# Patient Record
Sex: Female | Born: 1965 | Race: Black or African American | Hispanic: No | Marital: Single | Smoking: Never smoker
Health system: Southern US, Community
[De-identification: ages and names within clinical notes are randomized; demographics above are authoritative.]

## PROBLEM LIST (undated history)

## (undated) DIAGNOSIS — I10 Essential (primary) hypertension: Secondary | ICD-10-CM

## (undated) DIAGNOSIS — R7303 Prediabetes: Secondary | ICD-10-CM

## (undated) HISTORY — DX: Prediabetes: R73.03

## (undated) HISTORY — PX: ABDOMINAL HYSTERECTOMY: SHX81

## (undated) HISTORY — PX: HERNIA REPAIR: SHX51

## (undated) HISTORY — DX: Essential (primary) hypertension: I10

---

## 2006-12-04 ENCOUNTER — Emergency Department (HOSPITAL_COMMUNITY): Admission: EM | Admit: 2006-12-04 | Discharge: 2006-12-04 | Payer: Self-pay | Admitting: Emergency Medicine

## 2006-12-05 ENCOUNTER — Emergency Department (HOSPITAL_COMMUNITY): Admission: EM | Admit: 2006-12-05 | Discharge: 2006-12-05 | Payer: Self-pay | Admitting: Emergency Medicine

## 2006-12-16 ENCOUNTER — Emergency Department (HOSPITAL_COMMUNITY): Admission: EM | Admit: 2006-12-16 | Discharge: 2006-12-16 | Payer: Self-pay | Admitting: Emergency Medicine

## 2010-10-19 ENCOUNTER — Emergency Department (HOSPITAL_COMMUNITY)
Admission: EM | Admit: 2010-10-19 | Discharge: 2010-10-19 | Payer: Self-pay | Source: Home / Self Care | Admitting: Emergency Medicine

## 2021-09-14 NOTE — Progress Notes (Signed)
Subjective:    Tonya Patel - 55 y.o. female MRN [id number]  Date of birth: 1966/08/13  HPI  Tonya Patel is to establish care and annual physical exam.  Current issues and/or concerns:  HYPERTENSION: Currently taking: see medication list Med Adherence: No, reports has not taken any blood pressure medications for at least 8 months due to unable to get refills from previous primary provider Adherence with salt restriction (low-salt diet): _0  Yes, also she is vegan Exercise: Yes _1  No _2  Home Monitoring?: _3  Yes    _4  No Smoking _5  Yes _6  No SOB? _7  Yes    _8  No Chest Pain?: _9  Yes    _10  No Comments: Reports history of being followed by Cardiology 11 years ago after her mother passed away. Was placed on Metoprolol at that time.  2. DIABETES TYPE 2: Reports in the past A1c was 6.5% and with diet improvement was able lower A1c without medication. Reports eats a lot of potatoes when she is on the go. She is vegan.  3. RIGHT FOOT CONCERN: Right foot with scaling skin. Wears steel toe boots at her job. Using over-the-counter creams without relief.  4. PAP SMEAR: Reports history of ovary surgery and fallopian tubes removed in 2003 related to tubal pregnancy. History of complete hysterectomy. Plans to get medical records released to our office for review.    ROS per HPI    Health Maintenance:  Health Maintenance Due  Topic Date Due   COVID-19 Vaccine (1) Never done   Pneumococcal Vaccine 53-7 Years old (1 - PCV) Never done   FOOT EXAM  Never done   OPHTHALMOLOGY EXAM  Never done   PAP SMEAR-Modifier  Never done   COLONOSCOPY (Pts 45-77yr Insurance coverage will need to be confirmed)  Never done   MAMMOGRAM  Never done    Past Medical History: Patient Active Problem List   Diagnosis Date Noted   Hyperlipidemia 09/20/2021   Essential hypertension 09/19/2021   Diabetes mellitus, type 2 ([facility] 09/19/2021    Social History   reports that she has never smoked.  She has never used smokeless tobacco. She reports that she does not currently use alcohol. She reports that she does not use drugs.   Family History  family history is not on file.   Medications: reviewed and updated   Objective:   Physical Exam BP (!) 177/96 (BP Location: Left Arm, Patient Position: Sitting, Cuff Size: Large)   Pulse (!) 124   Temp 98.4 F (36.9 C)   Resp 18   Ht 5' 6.61" (1.692 m)   Wt 188 lb 12.8 oz (85.6 kg)   SpO2 97%   BMI 29.91 kg/m   Physical Exam HENT:     Head: Normocephalic and atraumatic.     Right Ear: Tympanic membrane, ear canal and external ear normal.     Left Ear: Tympanic membrane, ear canal and external ear normal.  Eyes:     Extraocular Movements: Extraocular movements intact.     Conjunctiva/sclera: Conjunctivae normal.     Pupils: Pupils are equal, round, and reactive to light.  Cardiovascular:     Rate and Rhythm: Normal rate and regular rhythm.     Pulses: Normal pulses.     Heart sounds: Normal heart sounds.  Pulmonary:     Effort: Pulmonary effort is normal.     Breath sounds: Normal breath sounds.  Chest:     Comments: Patient declined exam. Abdominal:     General: Bowel  sounds are normal.     Palpations: Abdomen is soft.  Genitourinary:    Comments: Patient declined exam. Musculoskeletal:        General: Normal range of motion.     Cervical back: Normal range of motion and neck supple.  Skin:    General: Skin is warm and dry.     Capillary Refill: Capillary refill takes less than 2 seconds.     Comments: Mild athlete's foot located at right foot only 4th and 5th toes.  Neurological:     General: No focal deficit present.     Mental Status: She is alert and oriented to person, place, and time.  Psychiatric:        Mood and Affect: Mood normal.        Behavior: Behavior normal.    Assessment & Plan:  1. Encounter to establish care: 2. Annual physical exam: - Counseled on 150 minutes of exercise per week as  tolerated, healthy eating (including decreased daily intake of saturated fats, cholesterol, added sugars, sodium), STI prevention, and routine healthcare maintenance.  3. Screening for metabolic disorder: - ONG29+BMWU to check kidney function, liver function, and electrolyte balance.  - CMP14+EGFR  4. Screening for deficiency anemia: - CBC to screen for anemia. - CBC  5. Screening cholesterol level: - Lipid panel to screen for high cholesterol.  - Lipid panel  6. Thyroid disorder screen: - TSH to check thyroid function.  - TSH  7. Need for hepatitis C screening test: - Hepatitis C antibody to screen for hepatitis C.  - Hepatitis C Antibody  8. Encounter for screening mammogram for malignant neoplasm of breast: - Referral for breast cancer screening by mammogram.  - MM Digital Screening; Future  9. Pap smear for cervical cancer screening: 10. Routine screening for STI (sexually transmitted infection): - Patient declined. Patient intends to have gynecological records released to our office for review.  11. Encounter for screening for HIV: - HIV antibody to screen for human immunodeficiency virus.  - HIV antibody (with reflex)  12. Colon cancer screening: - Referral to Gastroenterology for colon cancer screening by colonoscopy. - Ambulatory referral to Gastroenterology  13. Essential hypertension: - Blood pressure not at goal during today's visit. Patient asymptomatic without chest pressure, chest pain, palpitations, shortness of breath, worst headache of life, and any additional red flag symptoms. - Patient without blood pressure medications for at least 8 months due to being unable to receive refills from previous primary provider.  - Resume Hydrochlorothiazide, Losartan, and Metoprolol Succinate as prescribed.  - Counseled on blood pressure goal of less than 130/80, low-sodium, DASH diet, medication compliance, 150 minutes of moderate intensity exercise per week as  tolerated. Discussed medication compliance, adverse effects. - Follow-up with primary provider in 2 weeks or sooner if needed.  - hydrochlorothiazide (HYDRODIURIL) 25 MG tablet; Take 1 tablet (25 mg total) by mouth daily.  Dispense: 90 tablet; Refill: 0 - losartan (COZAAR) 100 MG tablet; Take 1 tablet (100 mg total) by mouth daily.  Dispense: 90 tablet; Refill: 0 - metoprolol succinate (TOPROL-XL) 50 MG 24 hr tablet; Take 1 tablet (50 mg total) by mouth daily. Take with or immediately following a meal.  Dispense: 90 tablet; Refill: 0  14. Type 2 diabetes mellitus without complication, without long-term current use of insulin ([place]): - Hemoglobin A1c today at goal at 6.9%, goal < 7%. - Counseled on recommendation of diabetic medication, patient declined.  - Discussed the importance of healthy eating habits, low-carbohydrate diet,  low-sugar diet, regular aerobic exercise (at least 150 minutes a week as tolerated) and medication compliance to achieve or maintain control of diabetes. - Follow-up with primary provider in 3 months or sooner if needed.  - POCT glycosylated hemoglobin (Hb A1C)  15. Tinea pedis of both feet: - Terbinafine cream as prescribed.  - Follow-up with primary provider as scheduled.  - terbinafine (LAMISIL) 1 % cream; Apply 1 application topically 2 (two) times daily for 14 days.  Dispense: 28 g; Refill: 0     Patient was given clear instructions to go to Emergency Department or return to medical center if symptoms don't improve, worsen, or new problems develop.The patient verbalized understanding.  I discussed the assessment and treatment plan with the patient. The patient was provided an opportunity to ask questions and all were answered. The patient agreed with the plan and demonstrated an understanding of the instructions.   The patient was advised to call back or seek an in-person evaluation if the symptoms worsen or if the condition fails to improve as  anticipated.    Durene Fruits, NP 09/21/2021, 7:57 AM Primary Care at [facility]

## 2021-09-19 ENCOUNTER — Encounter (INDEPENDENT_AMBULATORY_CARE_PROVIDER_SITE_OTHER): Payer: Self-pay

## 2021-09-19 ENCOUNTER — Encounter: Payer: Self-pay | Admitting: Family

## 2021-09-19 ENCOUNTER — Other Ambulatory Visit: Payer: Self-pay

## 2021-09-19 ENCOUNTER — Ambulatory Visit (INDEPENDENT_AMBULATORY_CARE_PROVIDER_SITE_OTHER): Payer: 59 | Admitting: Family

## 2021-09-19 VITALS — BP 177/96 | HR 124 | Temp 98.4°F | Resp 18 | Ht 66.61 in | Wt 188.8 lb

## 2021-09-19 DIAGNOSIS — Z13220 Encounter for screening for lipoid disorders: Secondary | ICD-10-CM

## 2021-09-19 DIAGNOSIS — E119 Type 2 diabetes mellitus without complications: Secondary | ICD-10-CM | POA: Insufficient documentation

## 2021-09-19 DIAGNOSIS — Z0001 Encounter for general adult medical examination with abnormal findings: Secondary | ICD-10-CM

## 2021-09-19 DIAGNOSIS — B353 Tinea pedis: Secondary | ICD-10-CM | POA: Diagnosis not present

## 2021-09-19 DIAGNOSIS — Z13228 Encounter for screening for other metabolic disorders: Secondary | ICD-10-CM

## 2021-09-19 DIAGNOSIS — Z114 Encounter for screening for human immunodeficiency virus [HIV]: Secondary | ICD-10-CM

## 2021-09-19 DIAGNOSIS — Z113 Encounter for screening for infections with a predominantly sexual mode of transmission: Secondary | ICD-10-CM

## 2021-09-19 DIAGNOSIS — I10 Essential (primary) hypertension: Secondary | ICD-10-CM

## 2021-09-19 DIAGNOSIS — Z0000 Encounter for general adult medical examination without abnormal findings: Secondary | ICD-10-CM

## 2021-09-19 DIAGNOSIS — Z7689 Persons encountering health services in other specified circumstances: Secondary | ICD-10-CM | POA: Diagnosis not present

## 2021-09-19 DIAGNOSIS — Z124 Encounter for screening for malignant neoplasm of cervix: Secondary | ICD-10-CM

## 2021-09-19 DIAGNOSIS — Z1159 Encounter for screening for other viral diseases: Secondary | ICD-10-CM

## 2021-09-19 DIAGNOSIS — Z130 Encounter for screening for diseases of the blood and blood-forming organs and certain disorders involving the immune mechanism: Secondary | ICD-10-CM

## 2021-09-19 DIAGNOSIS — Z1211 Encounter for screening for malignant neoplasm of colon: Secondary | ICD-10-CM

## 2021-09-19 DIAGNOSIS — Z1231 Encounter for screening mammogram for malignant neoplasm of breast: Secondary | ICD-10-CM

## 2021-09-19 DIAGNOSIS — Z1329 Encounter for screening for other suspected endocrine disorder: Secondary | ICD-10-CM

## 2021-09-19 DIAGNOSIS — Z131 Encounter for screening for diabetes mellitus: Secondary | ICD-10-CM

## 2021-09-19 MED ORDER — HYDROCHLOROTHIAZIDE 25 MG PO TABS: 25.0000 mg | ORAL_TABLET | Freq: Every day | ORAL | 0 refills | Status: DC

## 2021-09-19 MED ORDER — METOPROLOL SUCCINATE ER 50 MG PO TB24: 50.0000 mg | ORAL_TABLET | Freq: Every day | ORAL | 0 refills | Status: DC

## 2021-09-19 MED ORDER — TERBINAFINE HCL 1 % EX CREA: 1.0000 "application " | TOPICAL_CREAM | Freq: Two times a day (BID) | CUTANEOUS | 0 refills | Status: AC

## 2021-09-19 MED ORDER — LOSARTAN POTASSIUM 100 MG PO TABS: 100.0000 mg | ORAL_TABLET | Freq: Every day | ORAL | 0 refills | Status: DC

## 2021-09-19 NOTE — Progress Notes (Signed)
Pt presents to establish care and annual physical w/pap, pt concerned about A1c due to her diet

## 2021-09-19 NOTE — Patient Instructions (Signed)
Thank you for choosing Primary Care at [facility] for your medical home!    Tonya Patel was seen by Camillia Herter, NP today.   Tonya Patel's primary care provider is Durene Fruits, NP.   For the best care possible,  you should try to see Durene Fruits, NP whenever you come to clinic.   We look forward to seeing you again soon!  If you have any questions about your visit today,  please call us at [phone]  Or feel free to reach your provider via [place].    Preventive Care 57-36 Years Old, Female Preventive care refers to lifestyle choices and visits with your health care provider that can promote health and wellness. Preventive care visits are also called wellness exams. What can I expect for my preventive care visit? Counseling Your health care provider may ask you questions about your: Medical history, including: Past medical problems. Family medical history. Pregnancy history. Current health, including: Menstrual cycle. Method of birth control. Emotional well-being. Home life and relationship well-being. Sexual activity and sexual health. Lifestyle, including: Alcohol, nicotine or tobacco, and drug use. Access to firearms. Diet, exercise, and sleep habits. Work and work Statistician. Sunscreen use. Safety issues such as seatbelt and bike helmet use. Physical exam Your health care provider will check your: Height and weight. These may be used to calculate your BMI (body mass index). BMI is a measurement that tells if you are at a healthy weight. Waist circumference. This measures the distance around your waistline. This measurement also tells if you are at a healthy weight and may help predict your risk of certain diseases, such as type 2 diabetes and high blood pressure. Heart rate and blood pressure. Body temperature. Skin for abnormal spots. What immunizations do I need? Vaccines are usually given at various ages, according to a schedule. Your health  care provider will recommend vaccines for you based on your age, medical history, and lifestyle or other factors, such as travel or where you work. What tests do I need? Screening Your health care provider may recommend screening tests for certain conditions. This may include: Lipid and cholesterol levels. Diabetes screening. This is done by checking your blood sugar (glucose) after you have not eaten for a while (fasting). Pelvic exam and Pap test. Hepatitis B test. Hepatitis C test. HIV (human immunodeficiency virus) test. STI (sexually transmitted infection) testing, if you are at risk. Lung cancer screening. Colorectal cancer screening. Mammogram. Talk with your health care provider about when you should start having regular mammograms. This may depend on whether you have a family history of breast cancer. BRCA-related cancer screening. This may be done if you have a family history of breast, ovarian, tubal, or peritoneal cancers. Bone density scan. This is done to screen for osteoporosis. Talk with your health care provider about your test results, treatment options, and if necessary, the need for more tests. Follow these instructions at home: Eating and drinking  Eat a diet that includes fresh fruits and vegetables, whole grains, lean protein, and low-fat dairy products. Take vitamin and mineral supplements as recommended by your health care provider. Do not drink alcohol if: Your health care provider tells you not to drink. You are pregnant, may be pregnant, or are planning to become pregnant. If you drink alcohol: Limit how much you have to 0-1 drink a day. Know how much alcohol is in your drink. In the [place] one drink equals one 12 oz bottle of beer (355 mL), one  5 oz glass of wine (148 mL), or one 1 oz glass of hard liquor (44 mL). Lifestyle Brush your teeth every morning and night with fluoride toothpaste. Floss one time each day. Exercise for at least 30 minutes 5 or more  days each week. Do not use any products that contain nicotine or tobacco. These products include cigarettes, chewing tobacco, and vaping devices, such as e-cigarettes. If you need help quitting, ask your health care provider. Do not use drugs. If you are sexually active, practice safe sex. Use a condom or other form of protection to prevent STIs. If you do not wish to become pregnant, use a form of birth control. If you plan to become pregnant, see your health care provider for a prepregnancy visit. Take aspirin only as told by your health care provider. Make sure that you understand how much to take and what form to take. Work with your health care provider to find out whether it is safe and beneficial for you to take aspirin daily. Find healthy ways to manage stress, such as: Meditation, yoga, or listening to music. Journaling. Talking to a trusted person. Spending time with friends and family. Minimize exposure to UV radiation to reduce your risk of skin cancer. Safety Always wear your seat belt while driving or riding in a vehicle. Do not drive: If you have been drinking alcohol. Do not ride with someone who has been drinking. When you are tired or distracted. While texting. If you have been using any mind-altering substances or drugs. Wear a helmet and other protective equipment during sports activities. If you have firearms in your house, make sure you follow all gun safety procedures. Seek help if you have been physically or sexually abused. What's next? Visit your health care provider once a year for an annual wellness visit. Ask your health care provider how often you should have your eyes and teeth checked. Stay up to date on all vaccines. This information is not intended to replace advice given to you by your health care provider. Make sure you discuss any questions you have with your health care provider. Document Revised: 04/04/2021 Document Reviewed: 04/04/2021 Elsevier  Patient Education  [place].

## 2021-09-20 ENCOUNTER — Encounter: Payer: Self-pay | Admitting: Family

## 2021-09-20 ENCOUNTER — Other Ambulatory Visit: Payer: Self-pay | Admitting: Family

## 2021-09-20 DIAGNOSIS — E785 Hyperlipidemia, unspecified: Secondary | ICD-10-CM | POA: Insufficient documentation

## 2021-09-20 LAB — CMP14+EGFR
ALT: 24 IU/L (ref 0–32)
AST: 19 IU/L (ref 0–40)
Albumin/Globulin Ratio: 1.4 (ref 1.2–2.2)
Albumin: 4.7 g/dL (ref 3.8–4.9)
Alkaline Phosphatase: 111 IU/L (ref 44–121)
BUN/Creatinine Ratio: 13 (ref 9–23)
BUN: 11 mg/dL (ref 6–24)
Bilirubin Total: 0.4 mg/dL (ref 0.0–1.2)
CO2: 27 mmol/L (ref 20–29)
Calcium: 10.3 mg/dL — ABNORMAL HIGH (ref 8.7–10.2)
Chloride: 98 mmol/L (ref 96–106)
Creatinine, Ser: 0.87 mg/dL (ref 0.57–1.00)
Globulin, Total: 3.4 g/dL (ref 1.5–4.5)
Glucose: 110 mg/dL — ABNORMAL HIGH (ref 70–99)
Potassium: 3.9 mmol/L (ref 3.5–5.2)
Sodium: 138 mmol/L (ref 134–144)
Total Protein: 8.1 g/dL (ref 6.0–8.5)
eGFR: 78 mL/min/{1.73_m2} (ref >59)

## 2021-09-20 LAB — CBC
Hematocrit: 39.6 % (ref 34.0–46.6)
Hemoglobin: 13.5 g/dL (ref 11.1–15.9)
MCH: 27.1 pg (ref 26.6–33.0)
MCHC: 34.1 g/dL (ref 31.5–35.7)
MCV: 80 fL (ref 79–97)
Platelets: 290 10*3/uL (ref 150–450)
RBC: 4.98 x10E6/uL (ref 3.77–5.28)
RDW: 14 % (ref 11.7–15.4)
WBC: 6.6 10*3/uL (ref 3.4–10.8)

## 2021-09-20 LAB — LIPID PANEL
Chol/HDL Ratio: 3.2 ratio (ref 0.0–4.4)
Cholesterol, Total: 219 mg/dL — ABNORMAL HIGH (ref 100–199)
HDL: 68 mg/dL (ref >39)
LDL Chol Calc (NIH): 137 mg/dL — ABNORMAL HIGH (ref 0–99)
Triglycerides: 77 mg/dL (ref 0–149)
VLDL Cholesterol Cal: 14 mg/dL (ref 5–40)

## 2021-09-20 LAB — HIV ANTIBODY (ROUTINE TESTING W REFLEX): HIV Screen 4th Generation wRfx: NONREACTIVE

## 2021-09-20 LAB — HEPATITIS C ANTIBODY: Hep C Virus Ab: <0.1 s/co ratio (ref 0.0–0.9)

## 2021-09-20 LAB — POCT GLYCOSYLATED HEMOGLOBIN (HGB A1C): Hemoglobin A1C: 6.9 % — AB (ref 4.0–5.6)

## 2021-09-20 LAB — TSH: TSH: 1.41 u[IU]/mL (ref 0.450–4.500)

## 2021-09-20 MED ORDER — ATORVASTATIN CALCIUM 40 MG PO TABS: 40.0000 mg | ORAL_TABLET | Freq: Every day | ORAL | 0 refills | Status: DC

## 2021-09-20 NOTE — Progress Notes (Signed)
Diabetes discussed in office.

## 2021-09-20 NOTE — Progress Notes (Signed)
Please call patient with update.   Kidney function normal.  Liver function normal.  Thyroid function normal.   No anemia.   Hepatitis C negative.   HIV negative.   Cholesterol higher than expected. High cholesterol may increase risk of heart attack and/or stroke. Consider eating more fruits, vegetables, and lean baked meats such as chicken or fish. Moderate intensity exercise at least 150 minutes as tolerated per week may help as well.   Begin Atorvastatin (Lipitor) for high cholesterol. Patient encouraged to recheck cholesterol in 6 to 8 weeks at office visit.  The following is for provider reference only: The 10-year ASCVD risk score (Arnett DK, et al., 2019) is: 30.8%   Values used to calculate the score:     Age: 55 years     Sex: Female     Is Non-Hispanic African American: Yes     Diabetic: Yes     Tobacco smoker: No     Systolic Blood Pressure: 177 mmHg     Is BP treated: Yes     HDL Cholesterol: 68 mg/dL     Total Cholesterol: 219 mg/dL

## 2021-09-21 NOTE — Telephone Encounter (Signed)
Spoke to pt in regards to medication. Pt informed about Lamisil can be purchased OTC and is the same medication provider prescribed.

## 2021-09-30 NOTE — Progress Notes (Deleted)
Patient ID: Tonya Patel, female    DOB: March 30, 1966  MRN: [id number]  CC: Hypertension Follow-Up   Subjective: Tonya Patel is a 55 y.o. female who presents for hypertension follow-up.   Her concerns today include:   HYPERTENSION FOLLOW-UP: 09/19/2021: - Patient without blood pressure medications for at least 8 months due to being unable to receive refills from previous primary provider.  - Resume Hydrochlorothiazide, Losartan, and Metoprolol Succinate as prescribed.   10/03/2021:  Patient Active Problem List   Diagnosis Date Noted   Hyperlipidemia 09/20/2021   Essential hypertension 09/19/2021   Diabetes mellitus, type 2 (HCC) 09/19/2021     Current Outpatient Medications on File Prior to Visit  Medication Sig Dispense Refill   atorvastatin (LIPITOR) 40 MG tablet Take 1 tablet (40 mg total) by mouth daily. 120 tablet 0   hydrochlorothiazide (HYDRODIURIL) 25 MG tablet Take 1 tablet (25 mg total) by mouth daily. 90 tablet 0   losartan (COZAAR) 100 MG tablet Take 1 tablet (100 mg total) by mouth daily. 90 tablet 0   metoprolol succinate (TOPROL-XL) 50 MG 24 hr tablet Take 1 tablet (50 mg total) by mouth daily. Take with or immediately following a meal. 90 tablet 0   terbinafine (LAMISIL) 1 % cream Apply 1 application topically 2 (two) times daily for 14 days. 28 g 0   No current facility-administered medications on file prior to visit.    No Known Allergies  Social History   Socioeconomic History   Marital status: Single    Spouse name: Not on file   Number of children: Not on file   Years of education: Not on file   Highest education level: Not on file  Occupational History   Not on file  Tobacco Use   Smoking status: Never   Smokeless tobacco: Never  Vaping Use   Vaping Use: Never used  Substance and Sexual Activity   Alcohol use: Not Currently   Drug use: Never   Sexual activity: Never    Birth control/protection: Surgical  Other Topics Concern    Not on file  Social History Narrative   Not on file   Social Determinants of Health   Financial Resource Strain: Not on file  Food Insecurity: Not on file  Transportation Needs: Not on file  Physical Activity: Not on file  Stress: Not on file  Social Connections: Not on file  Intimate Partner Violence: Not on file    No family history on file.  Past Surgical History:  Procedure Laterality Date   ABDOMINAL HYSTERECTOMY     HERNIA REPAIR      ROS: Review of Systems Negative except as stated above  PHYSICAL EXAM: There were no vitals taken for this visit.  Physical Exam  {female adult master:310786} {female adult master:310785}  CMP Latest Ref Rng & Units 09/19/2021  Glucose 70 - 99 mg/dL 151(V)  BUN 6 - 24 mg/dL 11  Creatinine 6.16 - 0.73 mg/dL 7.10  Sodium 626 - 948 mmol/L 138  Potassium 3.5 - 5.2 mmol/L 3.9  Chloride 96 - 106 mmol/L 98  CO2 20 - 29 mmol/L 27  Calcium 8.7 - 10.2 mg/dL 10.3(H)  Total Protein 6.0 - 8.5 g/dL 8.1  Total Bilirubin 0.0 - 1.2 mg/dL 0.4  Alkaline Phos 44 - 121 IU/L 111  AST 0 - 40 IU/L 19  ALT 0 - 32 IU/L 24   Lipid Panel     Component Value Date/Time   CHOL 219 (H) 09/19/2021 1707  TRIG 77 09/19/2021 1707   HDL 68 09/19/2021 1707   CHOLHDL 3.2 09/19/2021 1707   LDLCALC 137 (H) 09/19/2021 1707    CBC    Component Value Date/Time   WBC 6.6 09/19/2021 1707   RBC 4.98 09/19/2021 1707   HGB 13.5 09/19/2021 1707   HCT 39.6 09/19/2021 1707   PLT 290 09/19/2021 1707   MCV 80 09/19/2021 1707   MCH 27.1 09/19/2021 1707   MCHC 34.1 09/19/2021 1707   RDW 14.0 09/19/2021 1707    ASSESSMENT AND PLAN:  There are no diagnoses linked to this encounter.   Patient was given the opportunity to ask questions.  Patient verbalized understanding of the plan and was able to repeat key elements of the plan. Patient was given clear instructions to go to Emergency Department or return to medical center if symptoms don't improve, worsen, or  new problems develop.The patient verbalized understanding.   No orders of the defined types were placed in this encounter.    Requested Prescriptions    No prescriptions requested or ordered in this encounter    No follow-ups on file.  Rema Fendt, NP

## 2021-10-01 ENCOUNTER — Telehealth: Payer: Self-pay | Admitting: Family

## 2021-10-01 NOTE — Telephone Encounter (Signed)
Pt states she can't make it to her appt this week because she's got a schedule FROM 5 AM -5PM. She could send her BP readings and could come in another time (nurse visit?) if PCP  deems necessary or come in w/ PCP but pt not sure what to do w/ schedule. Pt asking for a call from nurse to know how best to sched.

## 2021-10-02 NOTE — Telephone Encounter (Signed)
Pt can follow-up with provider at her preference and convenience.

## 2021-10-03 ENCOUNTER — Ambulatory Visit: Payer: 59 | Admitting: Family

## 2021-10-03 DIAGNOSIS — I10 Essential (primary) hypertension: Secondary | ICD-10-CM

## 2021-10-29 ENCOUNTER — Other Ambulatory Visit: Payer: Self-pay

## 2021-10-29 ENCOUNTER — Ambulatory Visit
Admission: RE | Admit: 2021-10-29 | Discharge: 2021-10-29 | Disposition: A | Discharge status: Home / Self Care | Payer: 59 | Source: Ambulatory Visit | Attending: Family | Admitting: Family

## 2021-10-29 DIAGNOSIS — Z1231 Encounter for screening mammogram for malignant neoplasm of breast: Secondary | ICD-10-CM

## 2021-10-31 ENCOUNTER — Encounter: Payer: Self-pay | Admitting: Family

## 2021-11-01 NOTE — Progress Notes (Signed)
Mammogram with no evidence of malignancy. Repeat in 12 months.

## 2021-12-28 ENCOUNTER — Other Ambulatory Visit: Payer: Self-pay | Admitting: Family

## 2021-12-28 DIAGNOSIS — I10 Essential (primary) hypertension: Secondary | ICD-10-CM

## 2022-01-01 NOTE — Progress Notes (Deleted)
? ? ?Patient ID: Tonya Patel, female    DOB: 23-Oct-1965  MRN: [id number] ? ?CC: Hypertension Follow-Up ? ?Subjective: ?Tonya Patel is a 56 y.o. female who presents for hypertension follow-up. ? ?Her concerns today include:  ?HYPERTENSION FOLLOW-UP: ?09/19/2021: ?- Resume Hydrochlorothiazide, Losartan, and Metoprolol Succinate as prescribed.  ? ?01/03/2022: ? ?2. DIABETES TYPE 2 FOLLOW-UP: ?09/19/2021: ?- Hemoglobin A1c today at goal at 6.9%, goal < 7%. ?- Counseled on recommendation of diabetic medication, patient declined.  ? ?01/03/2022: ? ?Patient Active Problem List  ? Diagnosis Date Noted  ? Hyperlipidemia 09/20/2021  ? Essential hypertension 09/19/2021  ? Diabetes mellitus, type 2 (HCC) 09/19/2021  ?  ? ?Current Outpatient Medications on File Prior to Visit  ?Medication Sig Dispense Refill  ? atorvastatin (LIPITOR) 40 MG tablet Take 1 tablet (40 mg total) by mouth daily. 120 tablet 0  ? hydrochlorothiazide (HYDRODIURIL) 25 MG tablet Take 1 tablet (25 mg total) by mouth daily. 90 tablet 0  ? losartan (COZAAR) 100 MG tablet Take 1 tablet (100 mg total) by mouth daily. 90 tablet 0  ? metoprolol succinate (TOPROL-XL) 50 MG 24 hr tablet Take 1 tablet (50 mg total) by mouth daily. Take with or immediately following a meal. 90 tablet 0  ? ?No current facility-administered medications on file prior to visit.  ? ? ?No Known Allergies ? ?Social History  ? ?Socioeconomic History  ? Marital status: Single  ?  Spouse name: Not on file  ? Number of children: Not on file  ? Years of education: Not on file  ? Highest education level: Not on file  ?Occupational History  ? Not on file  ?Tobacco Use  ? Smoking status: Never  ? Smokeless tobacco: Never  ?Vaping Use  ? Vaping Use: Never used  ?Substance and Sexual Activity  ? Alcohol use: Not Currently  ? Drug use: Never  ? Sexual activity: Never  ?  Birth control/protection: Surgical  ?Other Topics Concern  ? Not on file  ?Social History Narrative  ? Not on file  ? ?Social  Determinants of Health  ? ?Financial Resource Strain: Not on file  ?Food Insecurity: Not on file  ?Transportation Needs: Not on file  ?Physical Activity: Not on file  ?Stress: Not on file  ?Social Connections: Not on file  ?Intimate Partner Violence: Not on file  ? ? ?Family History  ?Problem Relation Age of Onset  ? Breast cancer Maternal Aunt   ? ? ?Past Surgical History:  ?Procedure Laterality Date  ? ABDOMINAL HYSTERECTOMY    ? HERNIA REPAIR    ? ? ?ROS: ?Review of Systems ?Negative except as stated above ? ?PHYSICAL EXAM: ?There were no vitals taken for this visit. ? ?Physical Exam ? ?{female adult master:310786} ?{female adult master:310785} ? ?CMP Latest Ref Rng & Units 09/19/2021  ?Glucose 70 - 99 mg/dL 062(B)  ?BUN 6 - 24 mg/dL 11  ?Creatinine 0.57 - 1.00 mg/dL 7.62  ?Sodium 134 - 144 mmol/L 138  ?Potassium 3.5 - 5.2 mmol/L 3.9  ?Chloride 96 - 106 mmol/L 98  ?CO2 20 - 29 mmol/L 27  ?Calcium 8.7 - 10.2 mg/dL 10.3(H)  ?Total Protein 6.0 - 8.5 g/dL 8.1  ?Total Bilirubin 0.0 - 1.2 mg/dL 0.4  ?Alkaline Phos 44 - 121 IU/L 111  ?AST 0 - 40 IU/L 19  ?ALT 0 - 32 IU/L 24  ? ?Lipid Panel  ?   ?Component Value Date/Time  ? CHOL 219 (H) 09/19/2021 1707  ? TRIG 77 09/19/2021  1707  ? HDL 68 09/19/2021 1707  ? CHOLHDL 3.2 09/19/2021 1707  ? LDLCALC 137 (H) 09/19/2021 1707  ? ? ?CBC ?   ?Component Value Date/Time  ? WBC 6.6 09/19/2021 1707  ? RBC 4.98 09/19/2021 1707  ? HGB 13.5 09/19/2021 1707  ? HCT 39.6 09/19/2021 1707  ? PLT 290 09/19/2021 1707  ? MCV 80 09/19/2021 1707  ? MCH 27.1 09/19/2021 1707  ? MCHC 34.1 09/19/2021 1707  ? RDW 14.0 09/19/2021 1707  ? ? ?ASSESSMENT AND PLAN: ? ?There are no diagnoses linked to this encounter. ? ? ?Patient was given the opportunity to ask questions.  Patient verbalized understanding of the plan and was able to repeat key elements of the plan. Patient was given clear instructions to go to Emergency Department or return to medical center if symptoms don't improve, worsen, or new  problems develop.The patient verbalized understanding. ? ? ?No orders of the defined types were placed in this encounter. ? ? ? ?Requested Prescriptions  ? ? No prescriptions requested or ordered in this encounter  ? ? ?No follow-ups on file. ? ?Rema Fendt, NP  ?

## 2022-01-03 ENCOUNTER — Ambulatory Visit: Payer: 59 | Admitting: Family

## 2022-01-03 DIAGNOSIS — I10 Essential (primary) hypertension: Secondary | ICD-10-CM

## 2022-01-03 DIAGNOSIS — E119 Type 2 diabetes mellitus without complications: Secondary | ICD-10-CM

## 2022-01-06 NOTE — Progress Notes (Signed)
? ? ?Patient ID: Tonya Patel, female    DOB: Jul 04, 1966  MRN: [id number] ? ?CC: Hypertension Follow-Up ? ?Subjective: ?Tonya Patel is a 56 y.o. female who presents for hypertension follow-up.  ? ?Her concerns today include: ?HYPERTENSION FOLLOW-UP: ?09/19/2021: ?- Patient without blood pressure medications for at least 8 months due to being unable to receive refills from previous primary provider.  ?- Resume Hydrochlorothiazide, Losartan, and Metoprolol Succinate as prescribed.  ? ?01/11/2022: ?No blood pressure medication for at least 1 week due to needing refills. Also, reports has new health insurance coverage as she was recently hired by her employer full-time. Reports prior to being without blood pressure medication her home numbers were normal. ? ? ?2. DIABETES TYPE 2 FOLLOW-UP: ?09/19/2021: ?- Counseled on recommendation of diabetic medication, patient declined.  ? ?01/11/2022: ?Concern about beginning diabetic medication. Reports she heard about the side effects of medications causing organ failure. Also, concerns about taking medications can cause more things to go as she states "wrong." Reports she does eat a lot of french fries to supplement as she is vegan. Reports she is aware that she should exercise and wants to start doing so soon. ? ? ?Patient Active Problem List  ? Diagnosis Date Noted  ? Hyperlipidemia 09/20/2021  ? Essential hypertension 09/19/2021  ? Diabetes mellitus, type 2 ([place]) 09/19/2021  ?  ? ?No current outpatient medications on file prior to visit.  ? ?No current facility-administered medications on file prior to visit.  ? ? ?No Known Allergies ? ?Social History  ? ?Socioeconomic History  ? Marital status: Single  ?  Spouse name: Not on file  ? Number of children: Not on file  ? Years of education: Not on file  ? Highest education level: Not on file  ?Occupational History  ? Not on file  ?Tobacco Use  ? Smoking status: Never  ?  Passive exposure: Never  ? Smokeless tobacco: Never   ?Vaping Use  ? Vaping Use: Never used  ?Substance and Sexual Activity  ? Alcohol use: Not Currently  ? Drug use: Never  ? Sexual activity: Never  ?  Birth control/protection: Surgical  ?Other Topics Concern  ? Not on file  ?Social History Narrative  ? Not on file  ? ?Social Determinants of Health  ? ?Financial Resource Strain: Not on file  ?Food Insecurity: Not on file  ?Transportation Needs: Not on file  ?Physical Activity: Not on file  ?Stress: Not on file  ?Social Connections: Not on file  ?Intimate Partner Violence: Not on file  ? ? ?Family History  ?Problem Relation Age of Onset  ? Breast cancer Maternal Aunt   ? ? ?Past Surgical History:  ?Procedure Laterality Date  ? ABDOMINAL HYSTERECTOMY    ? HERNIA REPAIR    ? ? ?ROS: ?Review of Systems ?Negative except as stated above ? ?PHYSICAL EXAM: ?BP (!) 180/80 (BP Location: Left Arm, Patient Position: Sitting, Cuff Size: Normal)   Pulse 78   Temp 98.3 ?F (36.8 ?C)   Resp 18   Ht 5' 6.61" (1.692 m)   Wt 195 lb (88.5 kg)   SpO2 98%   BMI 30.90 kg/m?  ? ?Physical Exam ?HENT:  ?   Head: Normocephalic and atraumatic.  ?Eyes:  ?   Extraocular Movements: Extraocular movements intact.  ?   Conjunctiva/sclera: Conjunctivae normal.  ?   Pupils: Pupils are equal, round, and reactive to light.  ?Cardiovascular:  ?   Rate and Rhythm: Normal rate and regular rhythm.  ?  Pulses: Normal pulses.  ?   Heart sounds: Normal heart sounds.  ?Pulmonary:  ?   Effort: Pulmonary effort is normal.  ?   Breath sounds: Normal breath sounds.  ?Musculoskeletal:  ?   Cervical back: Normal range of motion and neck supple.  ?Neurological:  ?   General: No focal deficit present.  ?   Mental Status: She is alert and oriented to person, place, and time.  ?Psychiatric:     ?   Mood and Affect: Mood normal.     ?   Behavior: Behavior normal.  ? ?Results for orders placed or performed in visit on 01/11/22  ?Lipid Panel  ?Result Value Ref Range  ? Cholesterol, Total 183 100 - 199 mg/dL  ?  Triglycerides 105 0 - 149 mg/dL  ? HDL 53 >39 mg/dL  ? VLDL Cholesterol Cal 19 5 - 40 mg/dL  ? LDL Chol Calc (NIH) 111 (H) 0 - 99 mg/dL  ? Chol/HDL Ratio 3.5 0.0 - 4.4 ratio  ?POCT glycosylated hemoglobin (Hb A1C)  ?Result Value Ref Range  ? Hemoglobin A1C 7.0 (A) 4.0 - 5.6 %  ? HbA1c POC (<> result, manual entry)    ? HbA1c, POC (prediabetic range)    ? HbA1c, POC (controlled diabetic range)    ? ? ?ASSESSMENT AND PLAN: ?1. Essential (primary) hypertension: ?- Blood pressure not at goal during today's visit. Patient asymptomatic without chest pressure, chest pain, palpitations, shortness of breath, worst headache of life, and any additional red flag symptoms. ?- Continue Hydrochlorothiazide, Losartan, and Metoprolol Succinate as prescribed.  ?- Counseled on blood pressure goal of less than 130/80, low-sodium, DASH diet, medication compliance, and 150 minutes of moderate intensity exercise per week as tolerated. Counseled on medication adherence and adverse effects. ?- Follow-up with primary provider in 1 week or sooner if needed for blood pressure check. ?- hydrochlorothiazide (HYDRODIURIL) 25 MG tablet; Take 1 tablet (25 mg total) by mouth daily.  Dispense: 90 tablet; Refill: 0 ?- losartan (COZAAR) 100 MG tablet; Take 1 tablet (100 mg total) by mouth daily.  Dispense: 90 tablet; Refill: 0 ?- metoprolol succinate (TOPROL-XL) 50 MG 24 hr tablet; Take 1 tablet (50 mg total) by mouth daily. Take with or immediately following a meal.  Dispense: 90 tablet; Refill: 0 ? ?2. Type 2 diabetes mellitus without complication, without long-term current use of insulin ([place]): ?- Hemoglobin A1c 7.0%. ?- Begin Metformin as prescribed. Counseled on medication adherence and adverse effects.  ?- Discussed the importance of healthy eating habits, low-carbohydrate diet, low-sugar diet, regular aerobic exercise (at least 150 minutes a week as tolerated) and medication compliance to achieve or maintain control of diabetes. ?- To achieve an  A1C goal of less than or equal to 7.0 percent, a fasting blood sugar of 80 to 130 mg/dL and a postprandial glucose (90 to 120 minutes after a meal) less than 180 mg/dL. In the event of sugars less than 60 mg/dl or greater than 400 mg/dl please notify the clinic ASAP. It is recommended that you undergo annual eye exams and annual foot exams. ?- Follow-up with primary provider in 4 weeks or sooner if needed. ?- POCT glycosylated hemoglobin (Hb A1C) ?- metFORMIN (GLUCOPHAGE) 500 MG tablet; Take 1 tablet (500 mg total) by mouth 2 (two) times daily with a meal.  Dispense: 180 tablet; Refill: 0 ?- glucose blood test strip; 1 each by Other route 4 (four) times daily -  before meals and at bedtime. Use as instructed  Dispense:  100 each; Refill: 12 ?- Blood Glucose Monitoring Suppl (ACCU-CHEK GUIDE ME) w/Device KIT; 1 each by Does not apply route 4 (four) times daily -  before meals and at bedtime.  Dispense: 1 kit; Refill: 0 ?- Accu-Chek FastClix Lancets MISC; 1 each by Does not apply route 4 (four) times daily -  before meals and at bedtime.  Dispense: 102 each; Refill: 2 ? ?3. Hyperlipidemia, unspecified hyperlipidemia type: ?- Continue Atorvastatin as prescribed.  ?- Update lipid panel.  ?- Follow-up with primary provider as scheduled.  ?- Lipid Panel ? ? ?Patient was given the opportunity to ask questions.  Patient verbalized understanding of the plan and was able to repeat key elements of the plan. Patient was given clear instructions to go to Emergency Department or return to medical center if symptoms don't improve, worsen, or new problems develop.The patient verbalized understanding. ? ? ?Orders Placed This Encounter  ?Procedures  ? Lipid Panel  ? POCT glycosylated hemoglobin (Hb A1C)  ? ? ? ?Requested Prescriptions  ? ?Signed Prescriptions Disp Refills  ? hydrochlorothiazide (HYDRODIURIL) 25 MG tablet 90 tablet 0  ?  Sig: Take 1 tablet (25 mg total) by mouth daily.  ? losartan (COZAAR) 100 MG tablet 90 tablet 0  ?   Sig: Take 1 tablet (100 mg total) by mouth daily.  ? metoprolol succinate (TOPROL-XL) 50 MG 24 hr tablet 90 tablet 0  ?  Sig: Take 1 tablet (50 mg total) by mouth daily. Take with or immediately following a meal.

## 2022-01-11 ENCOUNTER — Ambulatory Visit (INDEPENDENT_AMBULATORY_CARE_PROVIDER_SITE_OTHER): Payer: 59 | Admitting: Family

## 2022-01-11 ENCOUNTER — Encounter: Payer: Self-pay | Admitting: Family

## 2022-01-11 ENCOUNTER — Other Ambulatory Visit: Payer: Self-pay

## 2022-01-11 VITALS — BP 180/80 | HR 78 | Temp 98.3°F | Resp 18 | Ht 66.61 in | Wt 195.0 lb

## 2022-01-11 DIAGNOSIS — E785 Hyperlipidemia, unspecified: Secondary | ICD-10-CM | POA: Diagnosis not present

## 2022-01-11 DIAGNOSIS — I10 Essential (primary) hypertension: Secondary | ICD-10-CM

## 2022-01-11 DIAGNOSIS — E119 Type 2 diabetes mellitus without complications: Secondary | ICD-10-CM

## 2022-01-11 LAB — POCT GLYCOSYLATED HEMOGLOBIN (HGB A1C): Hemoglobin A1C: 7 % — AB (ref 4.0–5.6)

## 2022-01-11 MED ORDER — GLUCOSE BLOOD VI STRP: 1.0000 | ORAL_STRIP | Freq: Three times a day (TID) | 12 refills | Status: AC

## 2022-01-11 MED ORDER — LOSARTAN POTASSIUM 100 MG PO TABS: 100.0000 mg | ORAL_TABLET | Freq: Every day | ORAL | 0 refills | Status: DC

## 2022-01-11 MED ORDER — METOPROLOL SUCCINATE ER 50 MG PO TB24: 50.0000 mg | ORAL_TABLET | Freq: Every day | ORAL | 0 refills | Status: DC

## 2022-01-11 MED ORDER — ACCU-CHEK GUIDE ME W/DEVICE KIT: 1.0000 | PACK | Freq: Three times a day (TID) | 0 refills | Status: AC

## 2022-01-11 MED ORDER — HYDROCHLOROTHIAZIDE 25 MG PO TABS: 25.0000 mg | ORAL_TABLET | Freq: Every day | ORAL | 0 refills | Status: DC

## 2022-01-11 MED ORDER — METFORMIN HCL 500 MG PO TABS: 500.0000 mg | ORAL_TABLET | Freq: Two times a day (BID) | ORAL | 0 refills | Status: DC

## 2022-01-11 MED ORDER — ACCU-CHEK FASTCLIX LANCETS MISC: 1.0000 | Freq: Three times a day (TID) | 2 refills | Status: AC

## 2022-01-11 NOTE — Progress Notes (Signed)
Pt presents for hypertension and diabetes follow-up  ?Marland KitchenA1c 6.9% 09/20/21 up to 7.0%  ?Pt is fasting today ?

## 2022-01-12 ENCOUNTER — Other Ambulatory Visit: Payer: Self-pay | Admitting: Family

## 2022-01-12 DIAGNOSIS — E785 Hyperlipidemia, unspecified: Secondary | ICD-10-CM

## 2022-01-12 LAB — LIPID PANEL
Chol/HDL Ratio: 3.5 ratio (ref 0.0–4.4)
Cholesterol, Total: 183 mg/dL (ref 100–199)
HDL: 53 mg/dL (ref >39)
LDL Chol Calc (NIH): 111 mg/dL — ABNORMAL HIGH (ref 0–99)
Triglycerides: 105 mg/dL (ref 0–149)
VLDL Cholesterol Cal: 19 mg/dL (ref 5–40)

## 2022-01-12 MED ORDER — ATORVASTATIN CALCIUM 40 MG PO TABS: 40.0000 mg | ORAL_TABLET | Freq: Every day | ORAL | 1 refills | Status: AC

## 2022-01-12 NOTE — Progress Notes (Signed)
Continue Atorvastatin for cholesterol maintenance.  ? ?Diabetes discussed in office.

## 2022-01-15 ENCOUNTER — Telehealth: Payer: Self-pay | Admitting: Family

## 2022-01-15 NOTE — Telephone Encounter (Signed)
Copied from [place] [id number]. Topic: General - Call Back - No Documentation ?>> Jan 14, 2022 11:12 AM Yvette Rack wrote: ?Reason for CRM: Pt stated she just had a missed call from the office. Pt requests call back ?

## 2022-01-24 NOTE — Progress Notes (Deleted)
? ? ?Patient ID: Tonya Patel, female    DOB: [birth year]  MRN: [id number] ? ?CC: Hypertension Follow-Up  ? ?Subjective: ?Tonya Patel is a 56 y.o. female who presents for hypertension follow-up.  ? ?Her concerns today include:  ?HYPERTENSION FOLLOW-UP: ?01/11/2022: ?- Continue Hydrochlorothiazide, Losartan, and Metoprolol Succinate as prescribed.  ? ?01/28/2022: ? ?Patient Active Problem List  ? Diagnosis Date Noted  ? Hyperlipidemia 09/20/2021  ? Essential hypertension 09/19/2021  ? Diabetes mellitus, type 2 ([place]) 09/19/2021  ?  ? ?Current Outpatient Medications on File Prior to Visit  ?Medication Sig Dispense Refill  ? Accu-Chek FastClix Lancets MISC 1 each by Does not apply route 4 (four) times daily -  before meals and at bedtime. 102 each 2  ? atorvastatin (LIPITOR) 40 MG tablet Take 1 tablet (40 mg total) by mouth daily. 90 tablet 1  ? Blood Glucose Monitoring Suppl (ACCU-CHEK GUIDE ME) w/Device KIT 1 each by Does not apply route 4 (four) times daily -  before meals and at bedtime. 1 kit 0  ? glucose blood test strip 1 each by Other route 4 (four) times daily -  before meals and at bedtime. Use as instructed 100 each 12  ? hydrochlorothiazide (HYDRODIURIL) 25 MG tablet Take 1 tablet (25 mg total) by mouth daily. 90 tablet 0  ? losartan (COZAAR) 100 MG tablet Take 1 tablet (100 mg total) by mouth daily. 90 tablet 0  ? metFORMIN (GLUCOPHAGE) 500 MG tablet Take 1 tablet (500 mg total) by mouth 2 (two) times daily with a meal. 180 tablet 0  ? metoprolol succinate (TOPROL-XL) 50 MG 24 hr tablet Take 1 tablet (50 mg total) by mouth daily. Take with or immediately following a meal. 90 tablet 0  ? ?No current facility-administered medications on file prior to visit.  ? ? ?No Known Allergies ? ?Social History  ? ?Socioeconomic History  ? Marital status: Single  ?  Spouse name: Not on file  ? Number of children: Not on file  ? Years of education: Not on file  ? Highest education level: Not on file  ?Occupational  History  ? Not on file  ?Tobacco Use  ? Smoking status: Never  ?  Passive exposure: Never  ? Smokeless tobacco: Never  ?Vaping Use  ? Vaping Use: Never used  ?Substance and Sexual Activity  ? Alcohol use: Not Currently  ? Drug use: Never  ? Sexual activity: Never  ?  Birth control/protection: Surgical  ?Other Topics Concern  ? Not on file  ?Social History Narrative  ? Not on file  ? ?Social Determinants of Health  ? ?Financial Resource Strain: Not on file  ?Food Insecurity: Not on file  ?Transportation Needs: Not on file  ?Physical Activity: Not on file  ?Stress: Not on file  ?Social Connections: Not on file  ?Intimate Partner Violence: Not on file  ? ? ?Family History  ?Problem Relation Age of Onset  ? Breast cancer Maternal Aunt   ? ? ?Past Surgical History:  ?Procedure Laterality Date  ? ABDOMINAL HYSTERECTOMY    ? HERNIA REPAIR    ? ? ?ROS: ?Review of Systems ?Negative except as stated above ? ?PHYSICAL EXAM: ?There were no vitals taken for this visit. ? ?Physical Exam ? ?{female adult master:310786} ?{female adult master:310785} ? ? ?  Latest Ref Rng & Units 09/19/2021  ?  5:07 PM  ?CMP  ?Glucose 70 - 99 mg/dL 110    ?BUN 6 - 24 mg/dL 11    ?  Creatinine 0.57 - 1.00 mg/dL 0.87    ?Sodium 134 - 144 mmol/L 138    ?Potassium 3.5 - 5.2 mmol/L 3.9    ?Chloride 96 - 106 mmol/L 98    ?CO2 20 - 29 mmol/L 27    ?Calcium 8.7 - 10.2 mg/dL 10.3    ?Total Protein 6.0 - 8.5 g/dL 8.1    ?Total Bilirubin 0.0 - 1.2 mg/dL 0.4    ?Alkaline Phos 44 - 121 IU/L 111    ?AST 0 - 40 IU/L 19    ?ALT 0 - 32 IU/L 24    ? ?Lipid Panel  ?   ?Component Value Date/Time  ? CHOL 183 01/11/2022 1548  ? TRIG 105 01/11/2022 1548  ? HDL 53 01/11/2022 1548  ? CHOLHDL 3.5 01/11/2022 1548  ? LDLCALC 111 (H) 01/11/2022 1548  ? ? ?CBC ?   ?Component Value Date/Time  ? WBC 6.6 09/19/2021 1707  ? RBC 4.98 09/19/2021 1707  ? HGB 13.5 09/19/2021 1707  ? HCT 39.6 09/19/2021 1707  ? PLT 290 09/19/2021 1707  ? MCV 80 09/19/2021 1707  ? MCH 27.1 09/19/2021 1707  ?  MCHC 34.1 09/19/2021 1707  ? RDW 14.0 09/19/2021 1707  ? ? ?ASSESSMENT AND PLAN: ? ?There are no diagnoses linked to this encounter. ? ? ?Patient was given the opportunity to ask questions.  Patient verbalized understanding of the plan and was able to repeat key elements of the plan. Patient was given clear instructions to go to Emergency Department or return to medical center if symptoms don't improve, worsen, or new problems develop.The patient verbalized understanding. ? ? ?No orders of the defined types were placed in this encounter. ? ? ? ?Requested Prescriptions  ? ? No prescriptions requested or ordered in this encounter  ? ? ?No follow-ups on file. ? ?Camillia Herter, NP  ?

## 2022-01-28 ENCOUNTER — Ambulatory Visit: Payer: 59 | Admitting: Family

## 2022-01-29 ENCOUNTER — Telehealth: Payer: Self-pay | Admitting: Pharmacist

## 2022-01-29 NOTE — Patient Outreach (Signed)
Patient appearing on report for [employer] [facility] - Hypertension Control report due to last documented ambulatory blood pressure of 177/96 on 09/19/2021. Next appointment with PCP is not scheduled  ? ?Outreached patient to discuss hypertension control and medication management.  ? ?Left voicemail for her to return my call at her convenience.  ? ?Catie Eppie Gibson, PharmD, BCACP ?[facility] ?[phone] ? ? ? ?

## 2022-02-07 NOTE — Telephone Encounter (Signed)
Patient appearing on report for [employer] [facility] - Hypertension Control report due to last documented ambulatory blood pressure of 180/80  on 01/11/22. Next appointment with PCP is not scheduled   ? ?Outreached patient to discuss hypertension control and medication management. Left voicemail for her to return my call at her convenience. I will also send a MyChart message.  ? ?Catie Eppie Gibson, PharmD, BCACP ?[facility] ?[phone] ? ?

## 2022-04-04 ENCOUNTER — Other Ambulatory Visit: Payer: Self-pay | Admitting: Family

## 2022-04-04 DIAGNOSIS — I10 Essential (primary) hypertension: Secondary | ICD-10-CM

## 2022-04-04 DIAGNOSIS — E119 Type 2 diabetes mellitus without complications: Secondary | ICD-10-CM

## 2022-04-04 NOTE — Telephone Encounter (Signed)
Refilled per patient request. 

## 2022-07-06 ENCOUNTER — Other Ambulatory Visit: Payer: Self-pay | Admitting: Family

## 2022-07-06 DIAGNOSIS — I10 Essential (primary) hypertension: Secondary | ICD-10-CM

## 2022-07-06 DIAGNOSIS — E119 Type 2 diabetes mellitus without complications: Secondary | ICD-10-CM

## 2022-10-13 IMAGING — MG MM DIGITAL SCREENING BILAT W/ TOMO AND CAD
8 series · 8 of 24 positions shown · non-contrast
Comparison: Previous exam(s).

Addendum:
:
Please Insert Correct Screening Template

ADDENDUM:
We will await priors before interpretation.
BI-RADS category 0-need prior imaging
EXAM:
DIGITAL SCREENING BILATERAL MAMMOGRAM WITH TOMOSYNTHESIS AND CAD
TECHNIQUE: Bilateral screening digital craniocaudal and mediolateral oblique
mammograms were obtained. Bilateral screening digital breast
tomosynthesis was performed. The images were evaluated with
computer-aided detection.

[L MLO synth-2D]
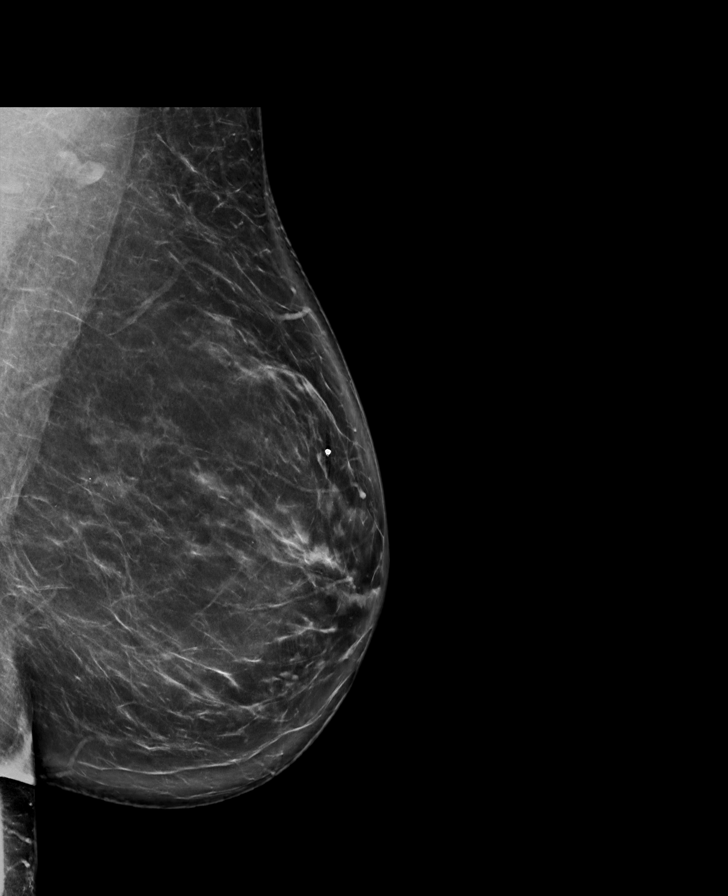

[L CC synth-2D]
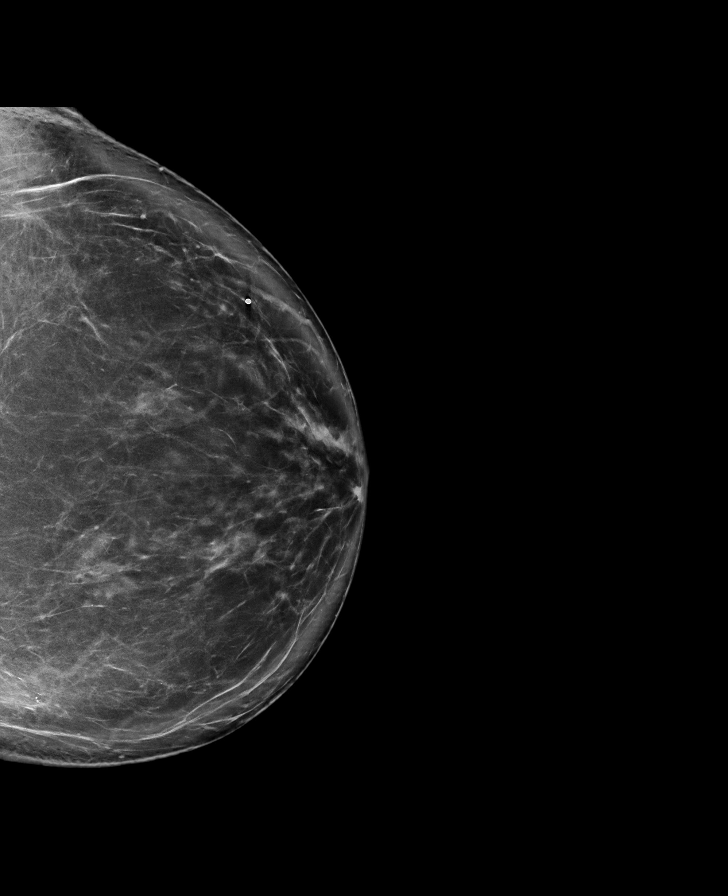

[R CC synth-2D]
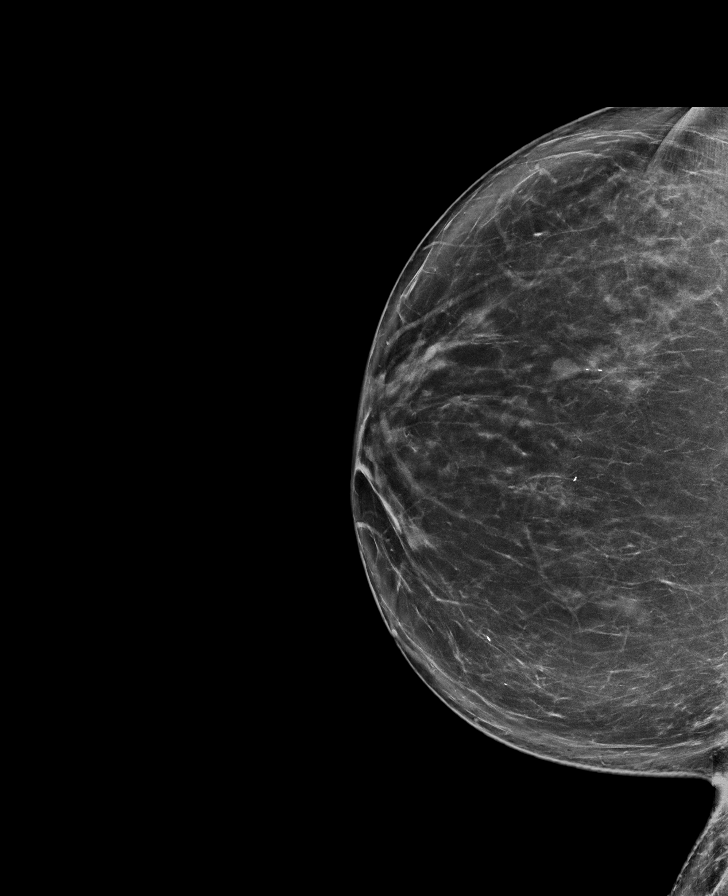

[R MLO synth-2D]
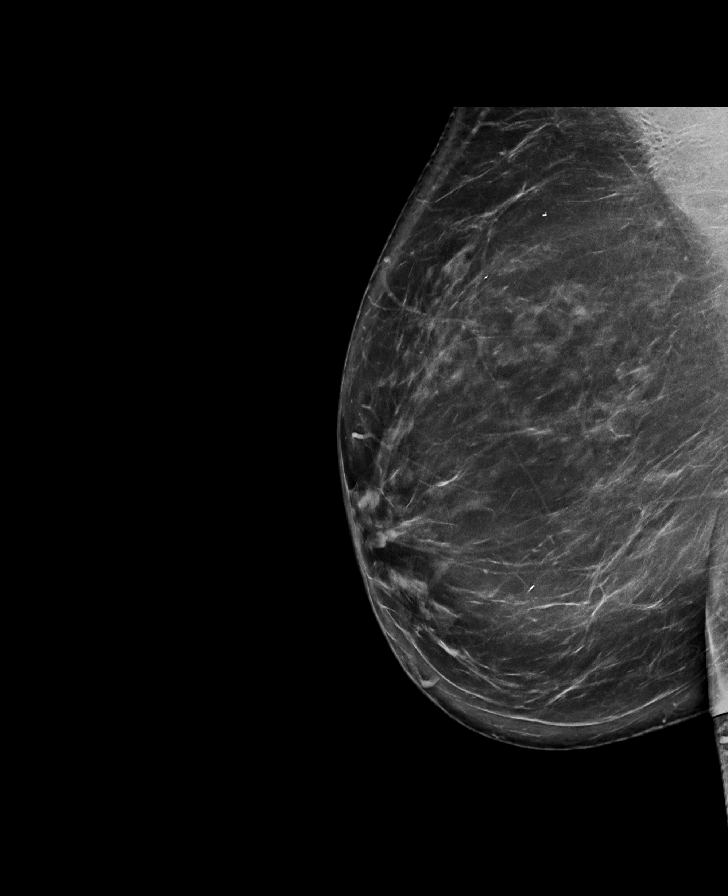

[R MLO tomo · tomo slice 51/102.0]
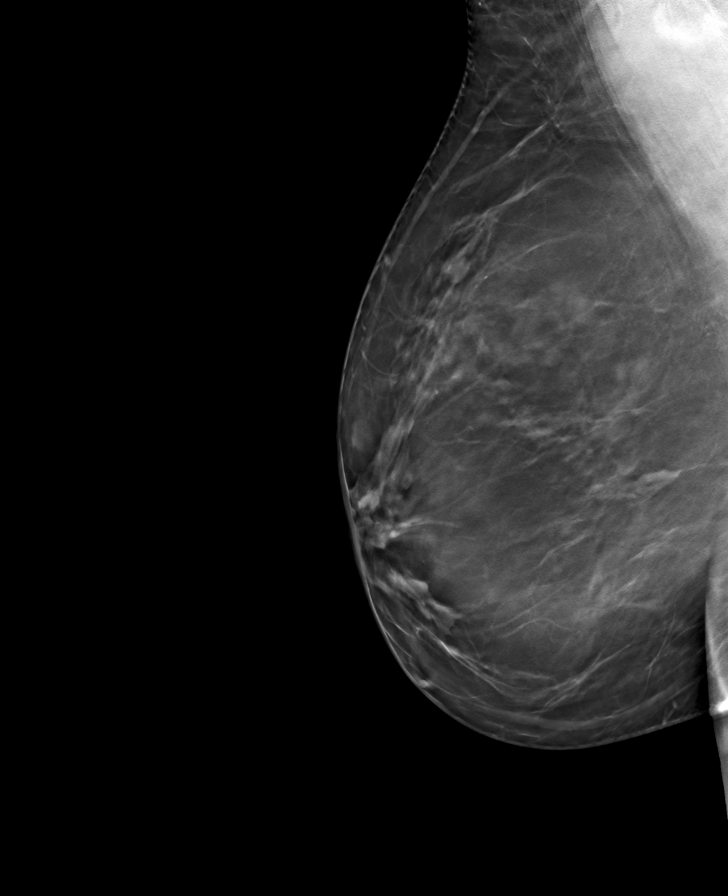

[L CC tomo · tomo slice 50/99.0]
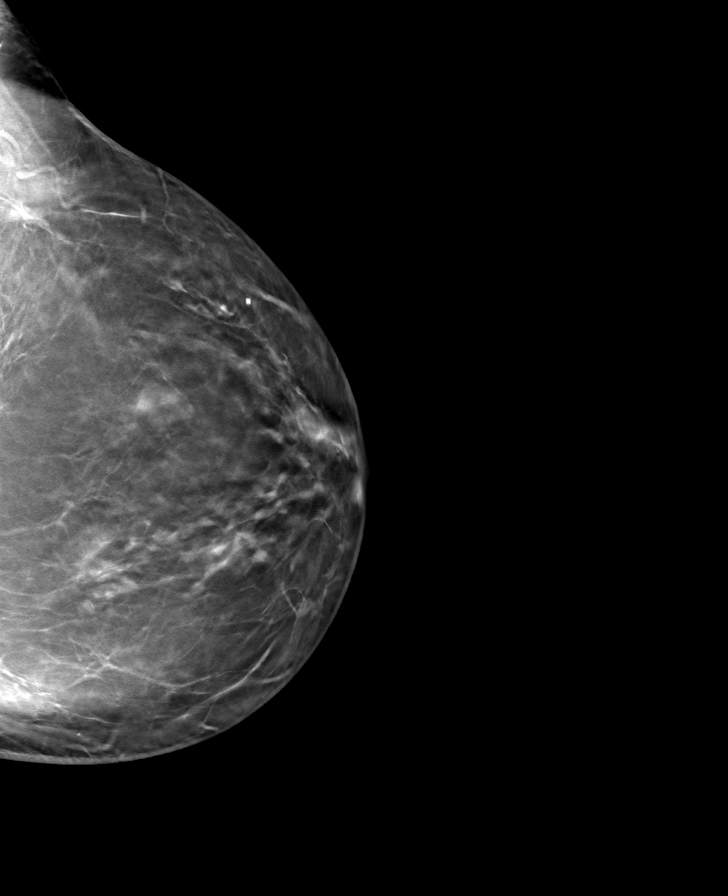

[L MLO tomo · tomo slice 53/104.0]
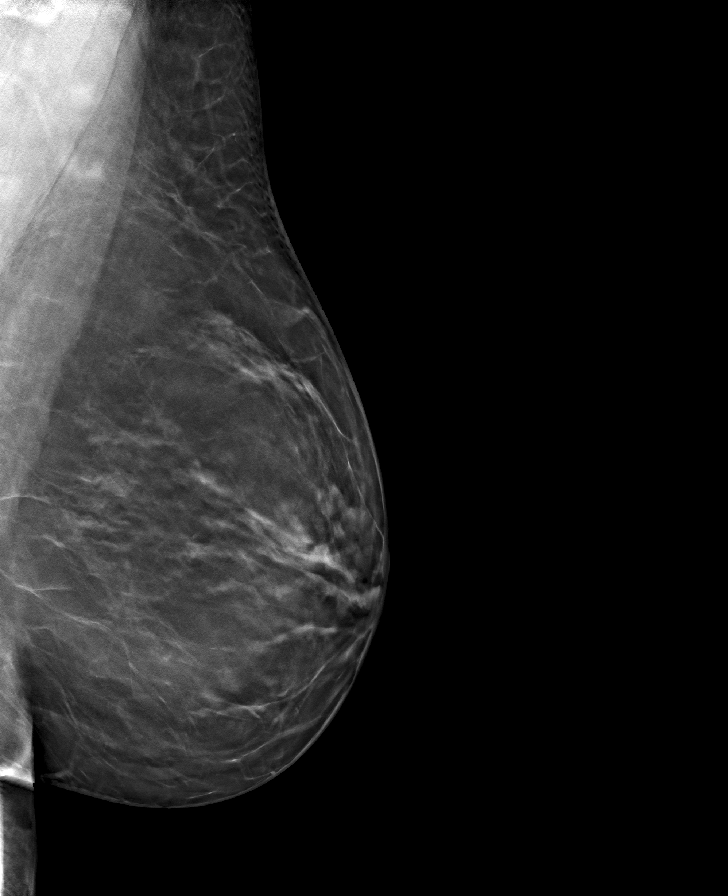

[R CC tomo · tomo slice 47/92.0]
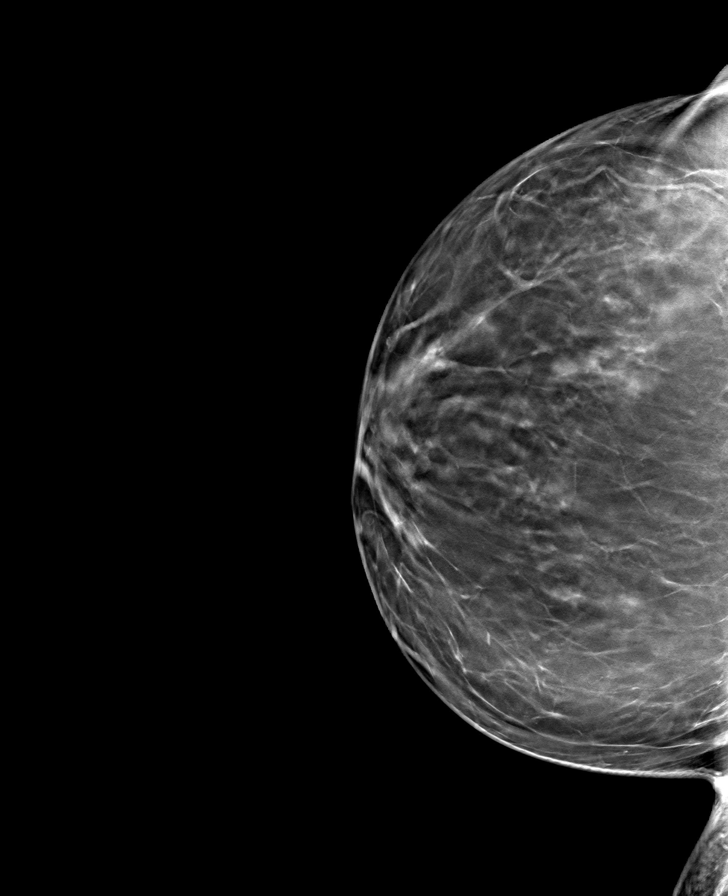

[8 of 24 positions shown; findings below may reference images not displayed]

ACR Breast Density Category b: There are scattered areas of
fibroglandular density.
FINDINGS: There are no findings suspicious for malignancy.
IMPRESSION: No mammographic evidence of malignancy. A result letter of this
screening mammogram will be mailed directly to the patient.

RECOMMENDATION:
Screening mammogram in one year. (Code:[id number]-K-YV7)

BI-RADS CATEGORY  1: Negative.

*** End of Addendum ***
Addendum:
:
Please Insert Correct Screening Template

ADDENDUM:
We will await priors before interpretation.

BI-RADS category 0-need prior imaging

*** End of Addendum ***
:
Please Insert Correct Screening Template

## 2022-12-02 ENCOUNTER — Other Ambulatory Visit: Payer: Self-pay | Admitting: Family

## 2022-12-02 DIAGNOSIS — Z1231 Encounter for screening mammogram for malignant neoplasm of breast: Secondary | ICD-10-CM

## 2022-12-10 ENCOUNTER — Ambulatory Visit
Admission: RE | Admit: 2022-12-10 | Discharge: 2022-12-10 | Disposition: A | Discharge status: Home / Self Care | Payer: 59 | Source: Ambulatory Visit | Attending: Family | Admitting: Family

## 2022-12-10 DIAGNOSIS — Z1231 Encounter for screening mammogram for malignant neoplasm of breast: Secondary | ICD-10-CM

## 2023-09-29 ENCOUNTER — Encounter: Payer: Self-pay | Admitting: Family

## 2023-09-29 NOTE — Telephone Encounter (Signed)
 Care team updated and letter sent for eye exam notes.

## 2024-10-20 ENCOUNTER — Encounter: Admitting: Dietician
